# Patient Record
Sex: Female | Born: 1985 | Race: White | Hispanic: No | Marital: Married | Smoking: Current every day smoker
Health system: Southern US, Community
[De-identification: ages and names within clinical notes are randomized; demographics above are authoritative.]

---

## 2004-12-14 ENCOUNTER — Ambulatory Visit: Payer: Self-pay | Admitting: Family Medicine

## 2004-12-14 ENCOUNTER — Other Ambulatory Visit: Admission: RE | Admit: 2004-12-14 | Discharge: 2004-12-14 | Payer: Self-pay | Admitting: Family Medicine

## 2005-02-11 ENCOUNTER — Ambulatory Visit: Payer: Self-pay | Admitting: Family Medicine

## 2005-03-11 ENCOUNTER — Ambulatory Visit: Payer: Self-pay | Admitting: Sports Medicine

## 2005-04-06 ENCOUNTER — Emergency Department (HOSPITAL_COMMUNITY): Admission: EM | Admit: 2005-04-06 | Discharge: 2005-04-07 | Payer: Self-pay | Admitting: Emergency Medicine

## 2005-04-11 ENCOUNTER — Emergency Department (HOSPITAL_COMMUNITY): Admission: EM | Admit: 2005-04-11 | Discharge: 2005-04-12 | Payer: Self-pay | Admitting: Emergency Medicine

## 2005-04-24 ENCOUNTER — Ambulatory Visit: Payer: Self-pay | Admitting: Family Medicine

## 2005-05-10 ENCOUNTER — Ambulatory Visit: Payer: Self-pay | Admitting: Family Medicine

## 2005-05-17 ENCOUNTER — Emergency Department (HOSPITAL_COMMUNITY): Admission: EM | Admit: 2005-05-17 | Discharge: 2005-05-17 | Payer: Self-pay | Admitting: Emergency Medicine

## 2005-06-25 ENCOUNTER — Ambulatory Visit: Payer: Self-pay | Admitting: Sports Medicine

## 2005-06-26 ENCOUNTER — Ambulatory Visit (HOSPITAL_COMMUNITY): Admission: RE | Admit: 2005-06-26 | Discharge: 2005-06-26 | Payer: Self-pay | Admitting: Family Medicine

## 2005-07-22 ENCOUNTER — Emergency Department (HOSPITAL_COMMUNITY): Admission: EM | Admit: 2005-07-22 | Discharge: 2005-07-23 | Payer: Self-pay | Admitting: *Deleted

## 2005-07-23 ENCOUNTER — Ambulatory Visit: Payer: Self-pay | Admitting: Family Medicine

## 2005-10-04 ENCOUNTER — Ambulatory Visit: Payer: Self-pay | Admitting: Family Medicine

## 2005-10-09 ENCOUNTER — Ambulatory Visit (HOSPITAL_COMMUNITY): Admission: RE | Admit: 2005-10-09 | Discharge: 2005-10-09 | Payer: Self-pay | Admitting: Family Medicine

## 2005-10-17 ENCOUNTER — Inpatient Hospital Stay (HOSPITAL_COMMUNITY): Admission: AD | Admit: 2005-10-17 | Discharge: 2005-10-17 | Payer: Self-pay | Admitting: Obstetrics and Gynecology

## 2005-10-18 ENCOUNTER — Ambulatory Visit: Payer: Self-pay | Admitting: Family Medicine

## 2005-10-21 ENCOUNTER — Ambulatory Visit: Payer: Self-pay | Admitting: Obstetrics and Gynecology

## 2005-10-25 ENCOUNTER — Ambulatory Visit: Payer: Self-pay | Admitting: Sports Medicine

## 2005-11-04 ENCOUNTER — Ambulatory Visit: Payer: Self-pay | Admitting: Sports Medicine

## 2005-11-13 ENCOUNTER — Ambulatory Visit: Payer: Self-pay | Admitting: Family Medicine

## 2005-11-22 ENCOUNTER — Ambulatory Visit: Payer: Self-pay | Admitting: Family Medicine

## 2005-11-25 ENCOUNTER — Ambulatory Visit: Payer: Self-pay | Admitting: Obstetrics & Gynecology

## 2005-11-26 ENCOUNTER — Ambulatory Visit: Payer: Self-pay | Admitting: Sports Medicine

## 2005-11-27 ENCOUNTER — Inpatient Hospital Stay (HOSPITAL_COMMUNITY): Admission: AD | Admit: 2005-11-27 | Discharge: 2005-11-29 | Payer: Self-pay | Admitting: *Deleted

## 2005-11-27 ENCOUNTER — Ambulatory Visit: Payer: Self-pay | Admitting: Obstetrics and Gynecology

## 2006-01-02 ENCOUNTER — Ambulatory Visit: Payer: Self-pay | Admitting: Family Medicine

## 2006-01-04 ENCOUNTER — Encounter (INDEPENDENT_AMBULATORY_CARE_PROVIDER_SITE_OTHER): Payer: Self-pay | Admitting: *Deleted

## 2006-01-04 LAB — CONVERTED CEMR LAB

## 2006-01-23 ENCOUNTER — Ambulatory Visit: Payer: Self-pay | Admitting: Family Medicine

## 2006-03-04 ENCOUNTER — Emergency Department (HOSPITAL_COMMUNITY): Admission: EM | Admit: 2006-03-04 | Discharge: 2006-03-04 | Payer: Self-pay | Admitting: Emergency Medicine

## 2006-03-13 ENCOUNTER — Ambulatory Visit: Payer: Self-pay | Admitting: Family Medicine

## 2006-04-06 ENCOUNTER — Inpatient Hospital Stay (HOSPITAL_COMMUNITY): Admission: AD | Admit: 2006-04-06 | Discharge: 2006-04-08 | Payer: Self-pay | Admitting: Family Medicine

## 2006-04-06 ENCOUNTER — Encounter: Payer: Self-pay | Admitting: Emergency Medicine

## 2006-04-06 ENCOUNTER — Ambulatory Visit: Payer: Self-pay | Admitting: Family Medicine

## 2006-09-27 ENCOUNTER — Inpatient Hospital Stay (HOSPITAL_COMMUNITY): Admission: AD | Admit: 2006-09-27 | Discharge: 2006-09-27 | Payer: Self-pay | Admitting: Obstetrics and Gynecology

## 2006-10-03 ENCOUNTER — Inpatient Hospital Stay (HOSPITAL_COMMUNITY): Admission: AD | Admit: 2006-10-03 | Discharge: 2006-10-04 | Payer: Self-pay | Admitting: Obstetrics and Gynecology

## 2006-11-27 DIAGNOSIS — G43909 Migraine, unspecified, not intractable, without status migrainosus: Secondary | ICD-10-CM | POA: Insufficient documentation

## 2006-11-28 ENCOUNTER — Encounter (INDEPENDENT_AMBULATORY_CARE_PROVIDER_SITE_OTHER): Payer: Self-pay | Admitting: *Deleted

## 2007-01-30 IMAGING — US US FETAL BPP W/O NONSTRESS
1 series · 14 of 28 positions shown · non-contrast
Comparison: none

CLINICAL DATA: 35 week 3 day assigned gestational age.  Fetal tachycardia.  Evaluate BPP.

[Series 1: us fetal bpp w/o nonstress · 0.29mm/px · 14 of 35 slices shown]
[im 2/35]
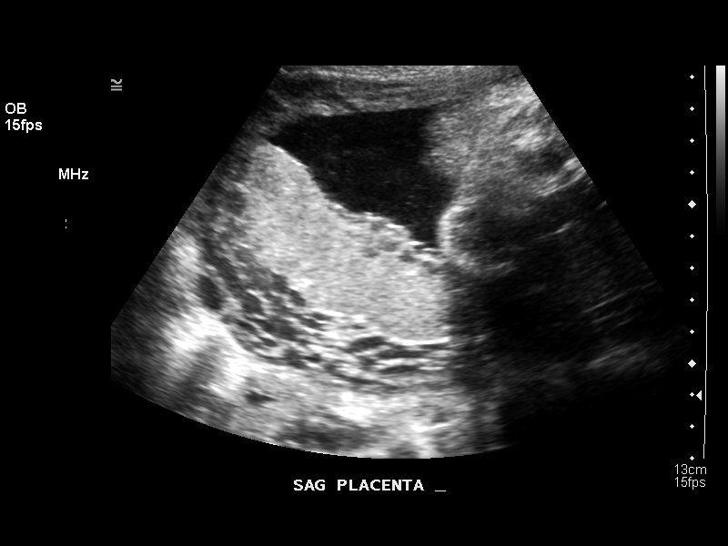
[im 4/35]
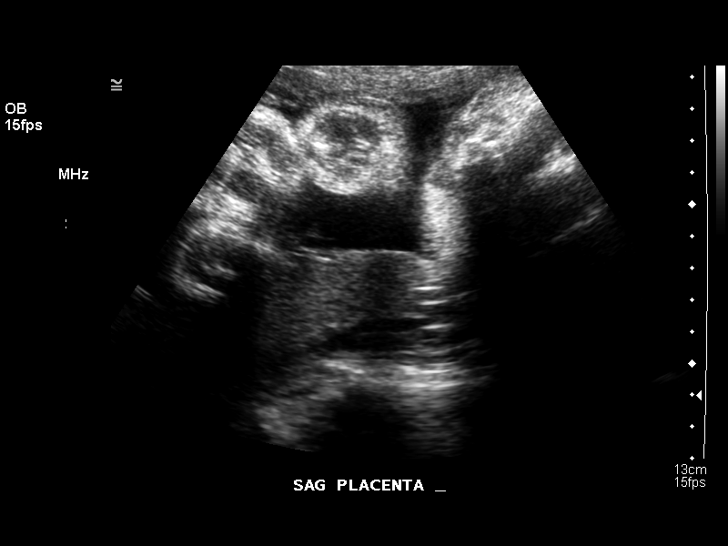
[im 7/35]
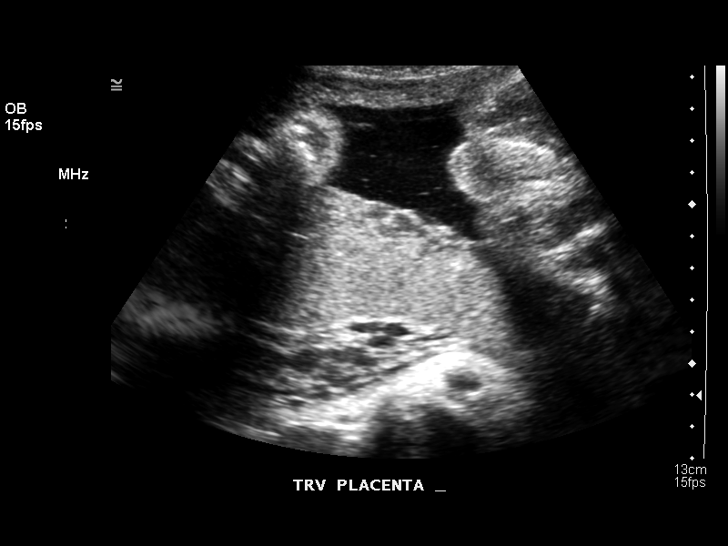
[im 9/35]
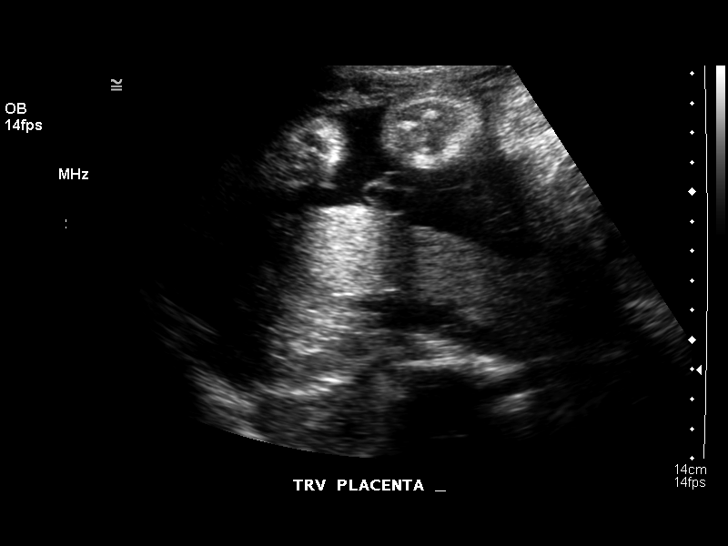
[im 12/35]
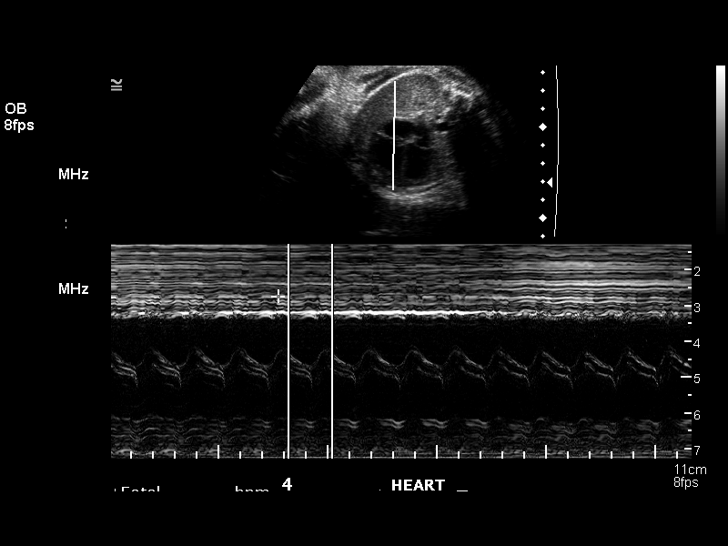
[im 14/35]
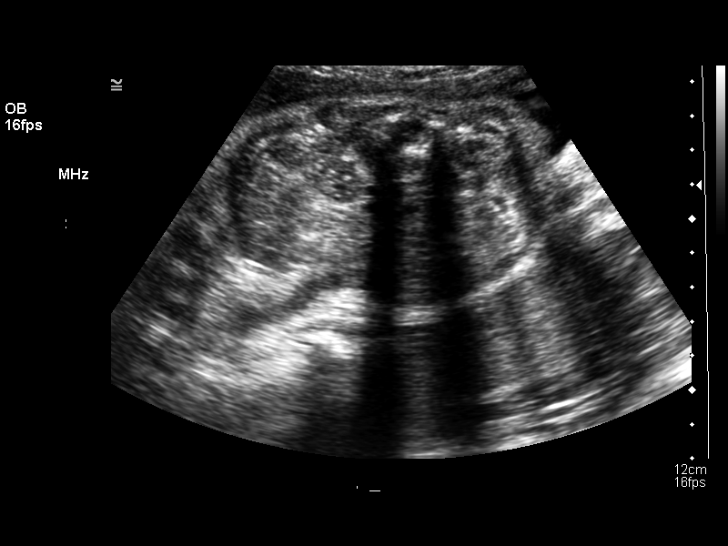
[im 17/35]
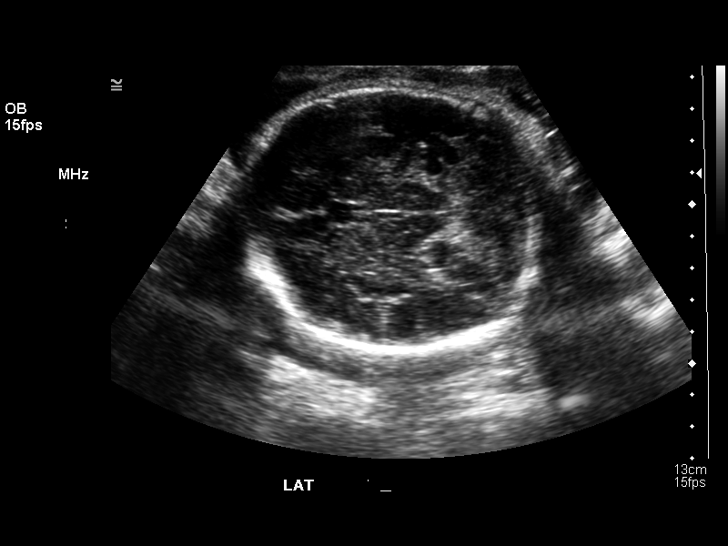
[im 19/35]
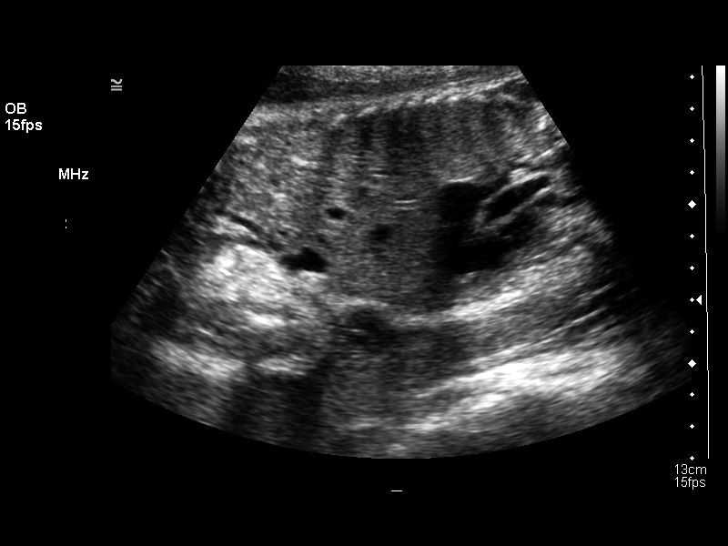
[im 22/35]
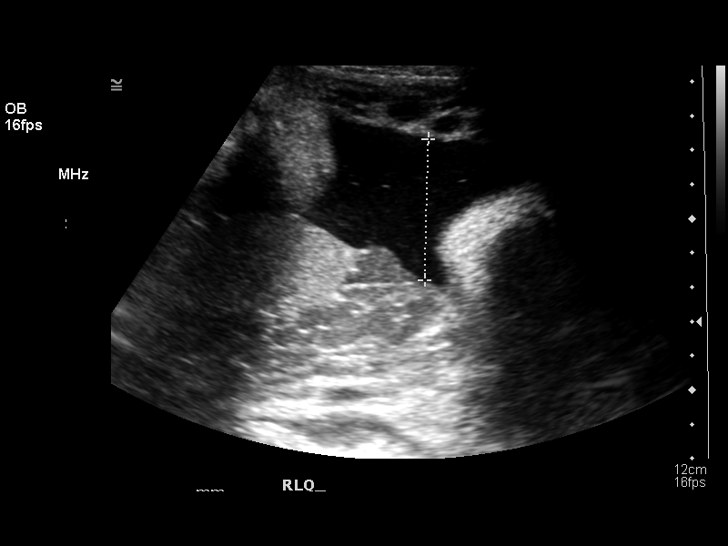
[im 24/35]
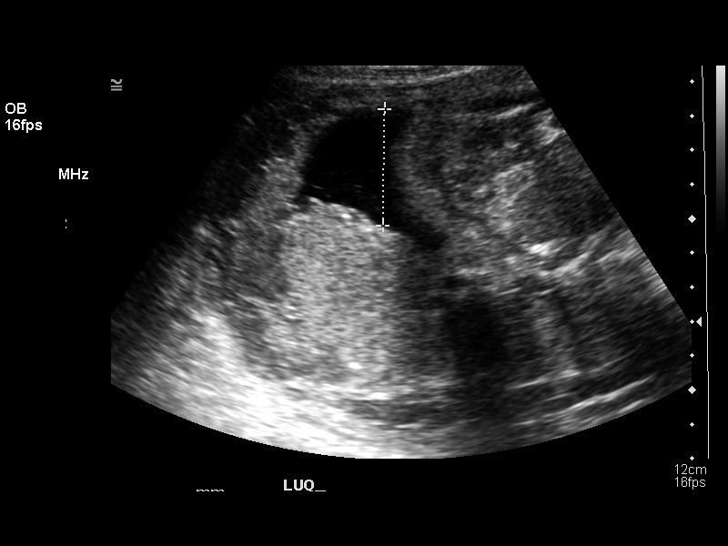
[im 27/35]
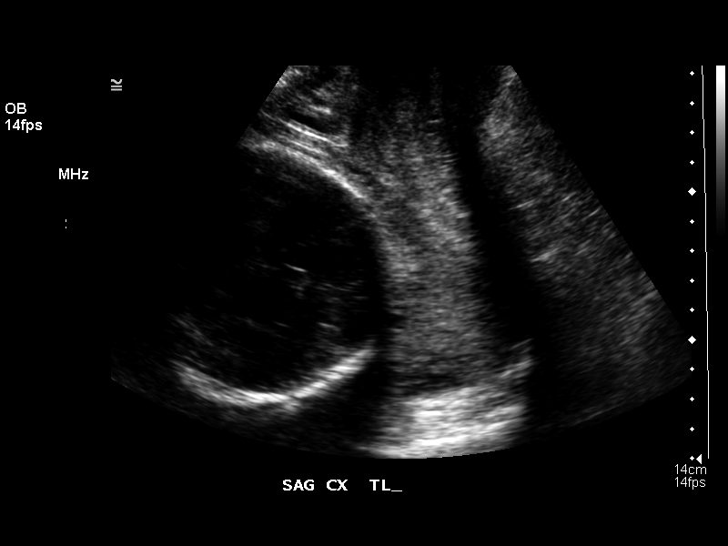
[im 29/35]
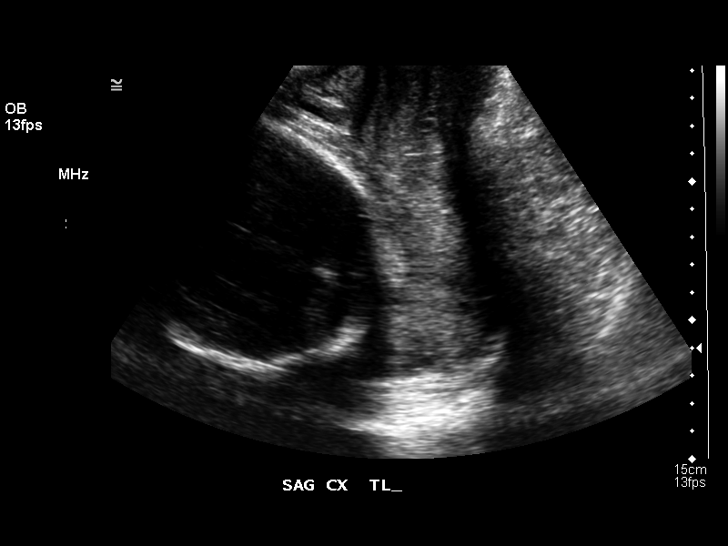
[im 32/35]
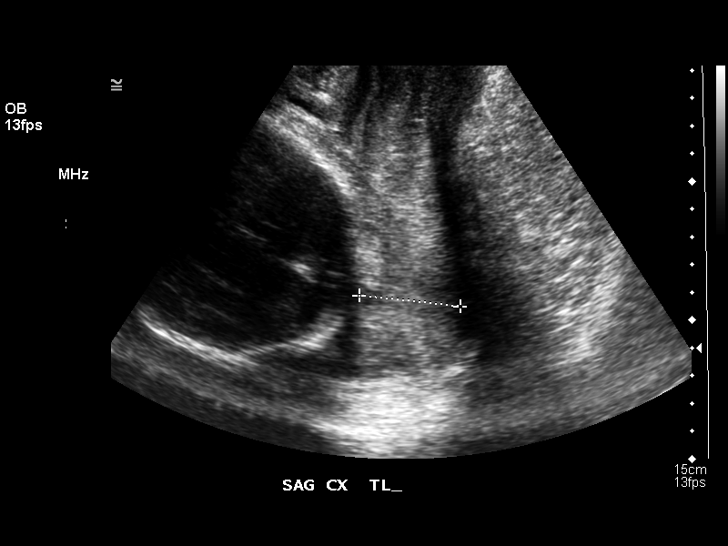
[im 35/35]
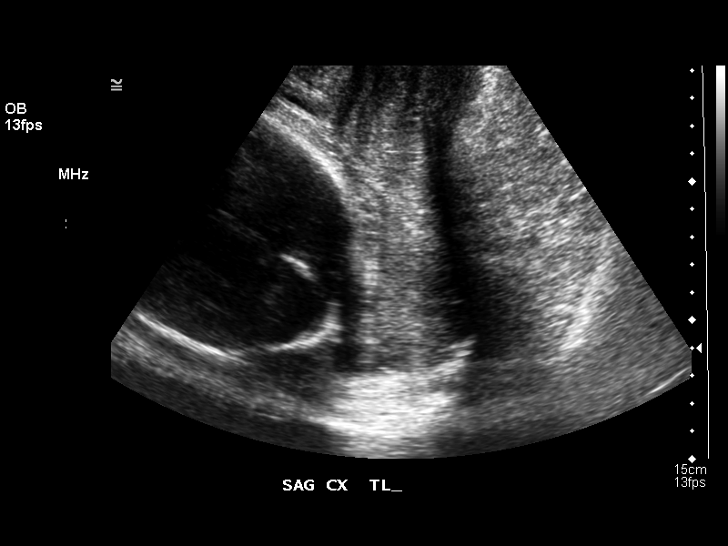

[14 of 28 positions shown; findings below may reference images not displayed]

BIOPHYSICAL PROFILE

 Number of Fetuses:  1
 Heart rate:  163
 Movement:  Yes
 Breathing:  No
 Presentation:  Cephalic
 Placental Location:  Posterior
 Grade:  I
 Previa:  No
 Amniotic Fluid (Subjective):  Normal
 Amniotic Fluid (Objective):  14.3 cm AFI (5th -95th%ile = 7.9 ? 24.9 cm for 35 wks)

 Fetal measurements and complete anatomic evaluation were not requested.  The following fetal anatomy was visualized on this exam:  Lateral ventricles, four chamber heart, stomach, 3-vessel cord, kidneys, bladder, and diaphragm.

 BPP SCORING
 Movements:  2  Time:  30 minutes
 Breathing:  0
 Tone:  2
 Amniotic Fluid: 2
 Total Score:  6

 MATERNAL UTERINE AND ADNEXAL FINDINGS
 Cervix:  3.6 cm Transabdominally
IMPRESSION: Single living intrauterine fetus in cephalic presentation.  Biophysical profile score [DATE].

## 2007-07-20 IMAGING — US US TRANSVAGINAL NON-OB
1 series · 13 of 25 positions shown · non-contrast
Comparison: none

CLINICAL DATA: 19-year-old with pelvic pain.  Rule out tuboovarian abscess.  The patient was begun on a course of antibiotics recently.  History of prior C-section.  IUD placed approximately three months ago.
TRANSABDOMINAL AND TRANSVAGINAL PELVIC ULTRASOUND:
TECHNIQUE: Both transabdominal and transvaginal ultrasound examinations of the pelvis were performed including evaluation of the uterus, ovaries, adnexal regions, and pelvic cul-de-sac.

[Series 1: gyn · 0.23mm/px · 13 of 47 slices shown]
[im 1/47]
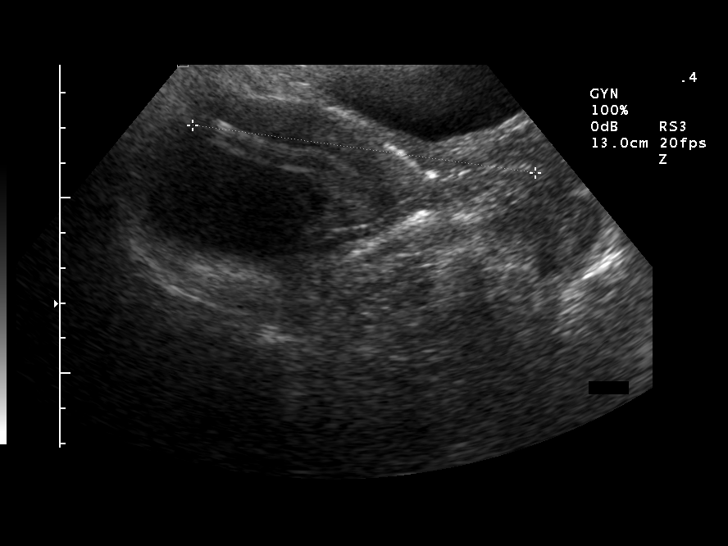
[im 4/47]
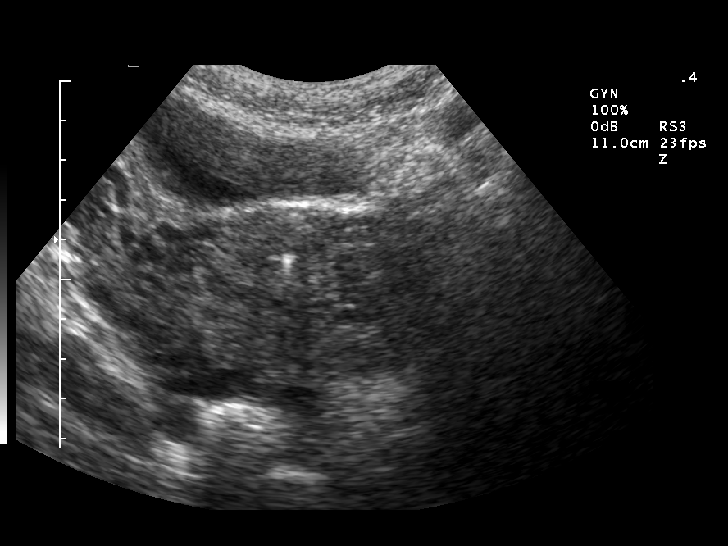
[im 8/47]
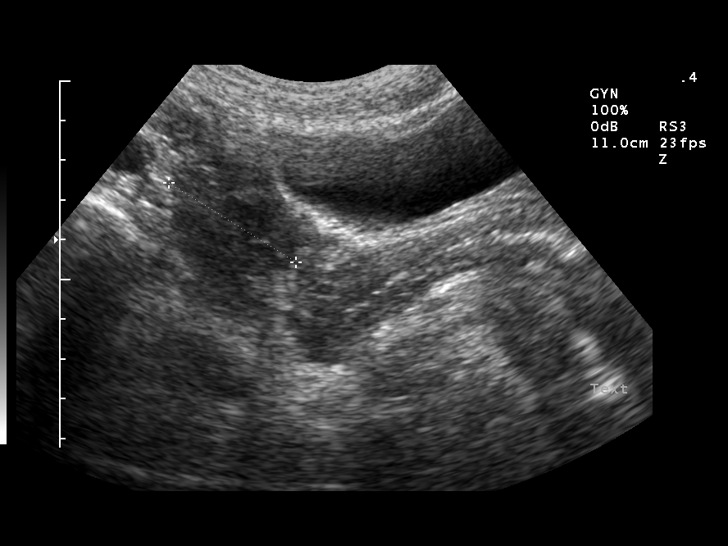
[im 12/47]
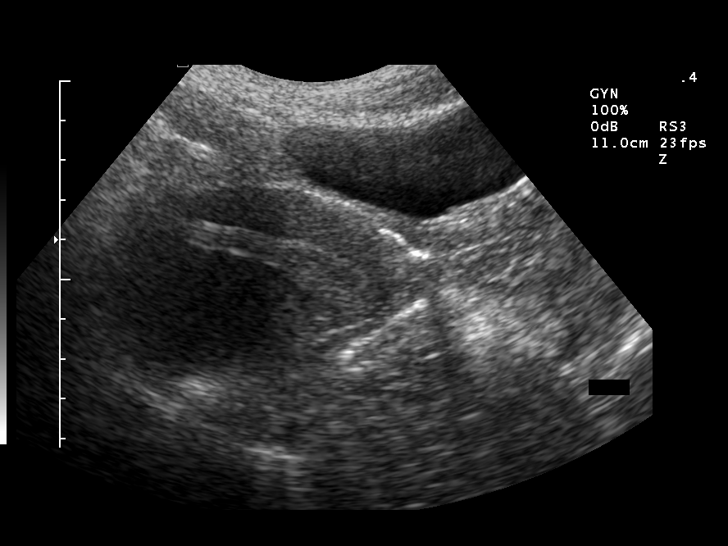
[im 16/47]
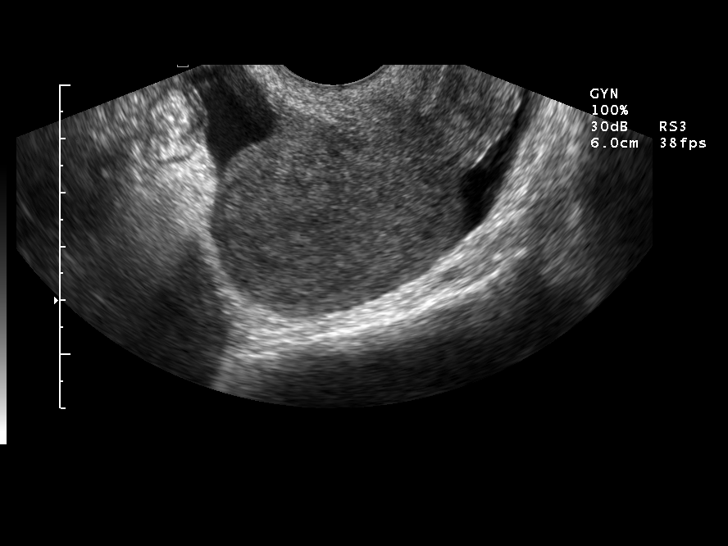
[im 20/47]
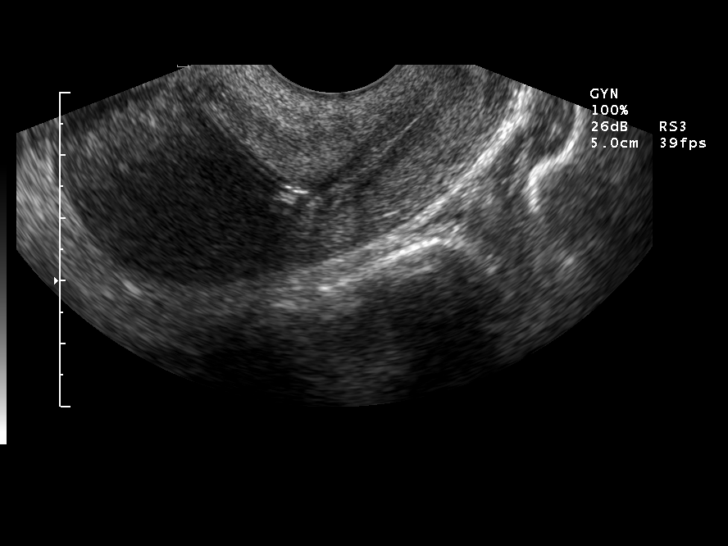
[im 24/47]
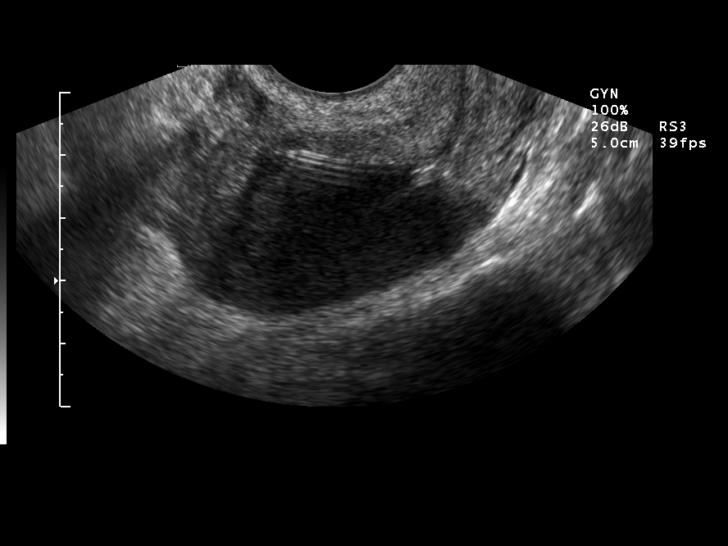
[im 27/47]
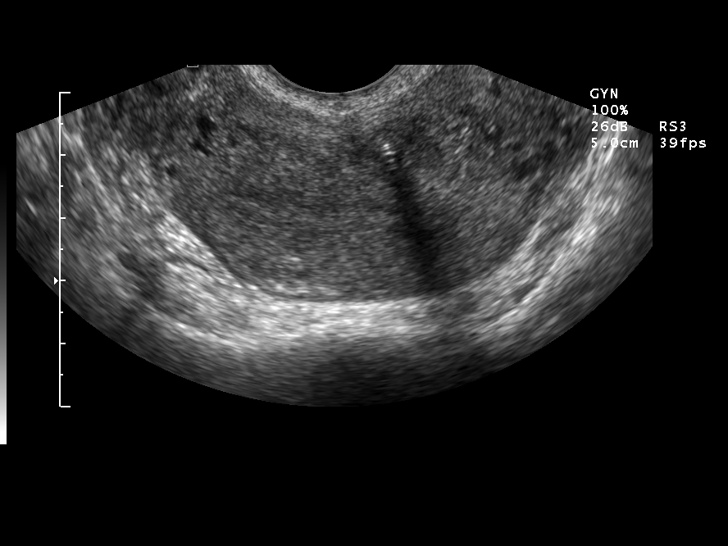
[im 31/47]
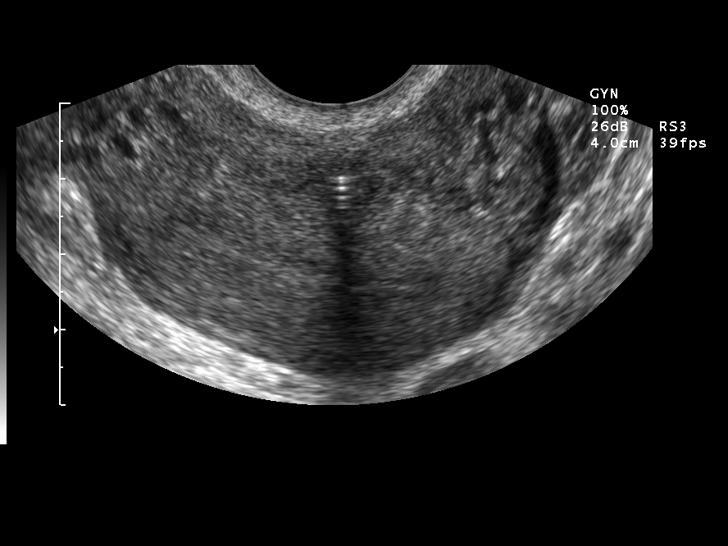
[im 35/47]
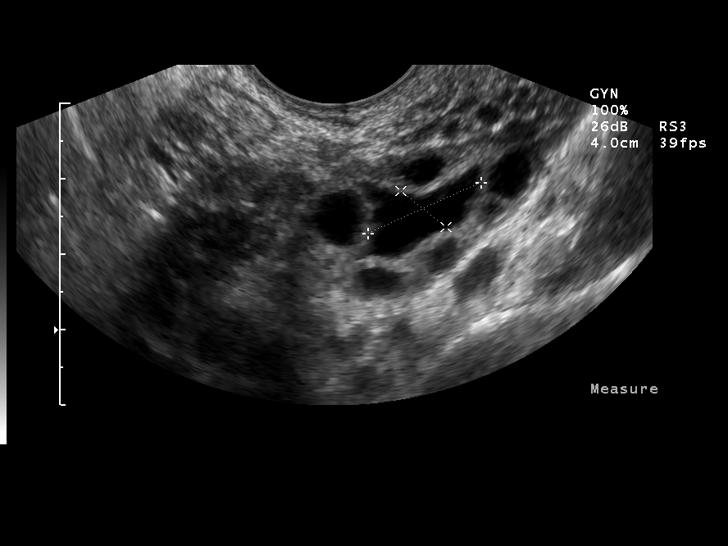
[im 39/47]
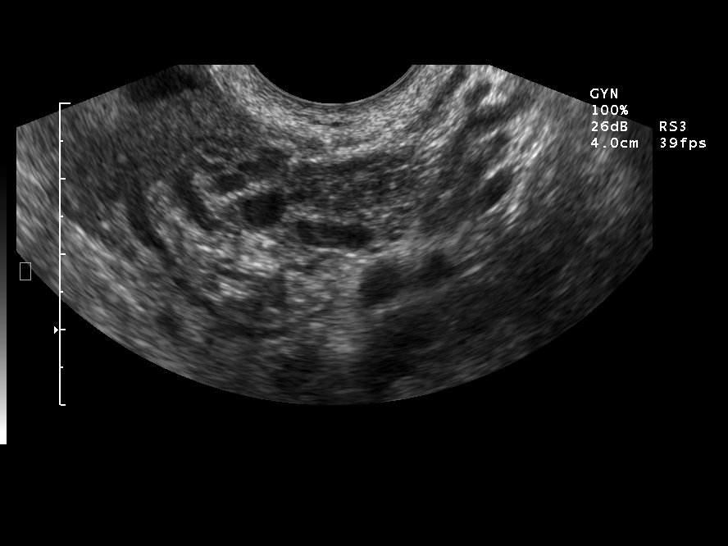
[im 43/47]
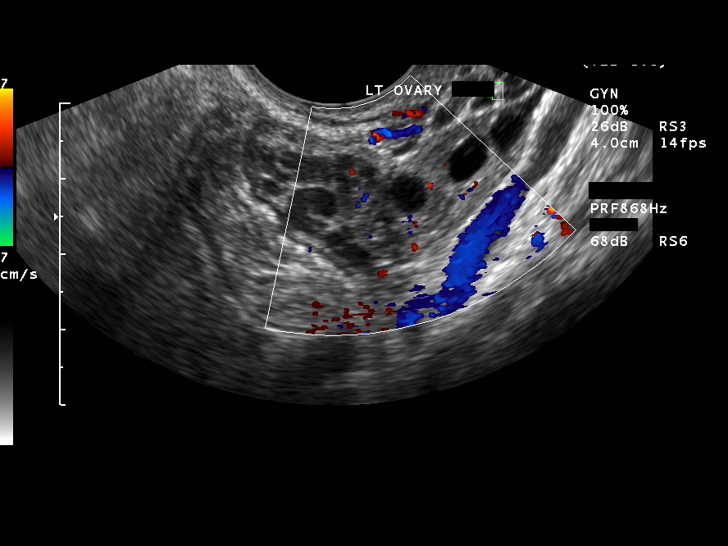
[im 47/47]
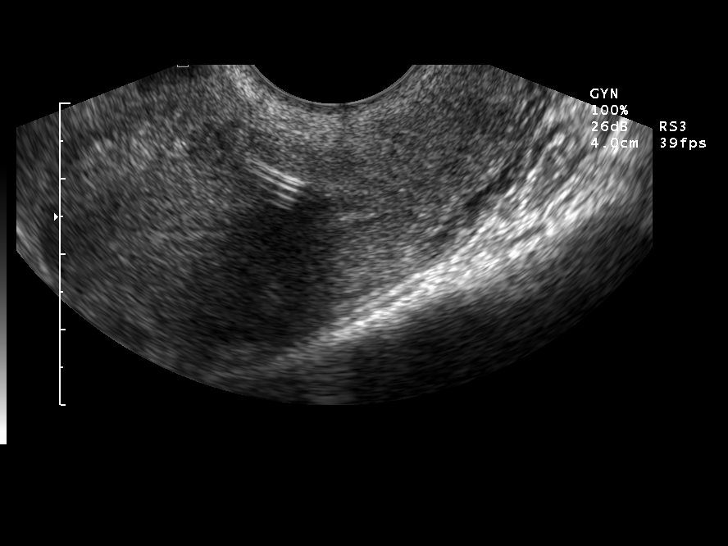

[13 of 25 positions shown; findings below may reference images not displayed]

FINDINGS: The uterus is 7.8 x 5.1 x 6.4 cm.  The endometrium is normal in thickness measuring 2.9 mm.  The patient has an intrauterine device which appears to be within the anterior myometrium, clearly anterior to the endometrial canal and slightly to the right of midline.  This may be within the patient?s C-section scar.  The uterus is otherwise normal in appearance.
Right ovary is 3.3 x 1.5 s 2.6 cm.  Left ovary is 3.8 x 1.8 x 2.9 cm.  There is prominence of the left fallopian tube and a small amount of fluid, consistent with hydrosalpinx.  No definite tuboovarian abscess at this time.  There is moderate free pelvic fluid and note is made of cervical motion tenderness at the time of exam.
IMPRESSION: 1.  Intrauterine device within the anterior myometrium, clearly anterior to the endometrial canal and possibly within the C-section scar.
2.  Prominence of the left fallopian tube consistent with hydrosalpinx or pyosalpinx.  Free pelvic fluid and cervical motion tenderness are consistent with pelvic inflammatory disease.  The findings were called to Dr. Sheen in the emergency department.

## 2019-04-22 ENCOUNTER — Other Ambulatory Visit: Payer: Self-pay

## 2019-04-22 ENCOUNTER — Encounter (HOSPITAL_COMMUNITY): Payer: Self-pay

## 2019-04-22 ENCOUNTER — Emergency Department (HOSPITAL_COMMUNITY)
Admission: EM | Admit: 2019-04-22 | Discharge: 2019-04-22 | Disposition: A | Discharge status: Home / Self Care | Payer: Self-pay | Attending: Emergency Medicine | Admitting: Emergency Medicine

## 2019-04-22 DIAGNOSIS — K529 Noninfective gastroenteritis and colitis, unspecified: Secondary | ICD-10-CM

## 2019-04-22 DIAGNOSIS — F17210 Nicotine dependence, cigarettes, uncomplicated: Secondary | ICD-10-CM | POA: Insufficient documentation

## 2019-04-22 DIAGNOSIS — A084 Viral intestinal infection, unspecified: Secondary | ICD-10-CM | POA: Insufficient documentation

## 2019-04-22 LAB — URINALYSIS, ROUTINE W REFLEX MICROSCOPIC
Bacteria, UA: NONE SEEN
Bilirubin Urine: NEGATIVE
Glucose, UA: NEGATIVE mg/dL
Ketones, ur: 80 mg/dL — AB
Leukocytes,Ua: NEGATIVE
Nitrite: NEGATIVE
Protein, ur: 100 mg/dL — AB
Specific Gravity, Urine: 1.03 (ref 1.005–1.030)
pH: 6 (ref 5.0–8.0)

## 2019-04-22 LAB — COMPREHENSIVE METABOLIC PANEL
ALT: 18 U/L (ref 0–44)
AST: 23 U/L (ref 15–41)
Albumin: 4 g/dL (ref 3.5–5.0)
Alkaline Phosphatase: 49 U/L (ref 38–126)
Anion gap: 14 (ref 5–15)
BUN: 13 mg/dL (ref 6–20)
CO2: 21 mmol/L — ABNORMAL LOW (ref 22–32)
Calcium: 9.1 mg/dL (ref 8.9–10.3)
Chloride: 104 mmol/L (ref 98–111)
Creatinine, Ser: 0.75 mg/dL (ref 0.44–1.00)
GFR calc Af Amer: >60 mL/min (ref >60)
GFR calc non Af Amer: >60 mL/min (ref >60)
Glucose, Bld: 89 mg/dL (ref 70–99)
Potassium: 3.7 mmol/L (ref 3.5–5.1)
Sodium: 139 mmol/L (ref 135–145)
Total Bilirubin: 1.2 mg/dL (ref 0.3–1.2)
Total Protein: 6.9 g/dL (ref 6.5–8.1)

## 2019-04-22 LAB — CBC
HCT: 41.8 % (ref 36.0–46.0)
Hemoglobin: 13.6 g/dL (ref 12.0–15.0)
MCH: 28.8 pg (ref 26.0–34.0)
MCHC: 32.5 g/dL (ref 30.0–36.0)
MCV: 88.4 fL (ref 80.0–100.0)
Platelets: 233 10*3/uL (ref 150–400)
RBC: 4.73 MIL/uL (ref 3.87–5.11)
RDW: 13.7 % (ref 11.5–15.5)
WBC: 14.7 10*3/uL — ABNORMAL HIGH (ref 4.0–10.5)
nRBC: 0 % (ref 0.0–0.2)

## 2019-04-22 LAB — I-STAT BETA HCG BLOOD, ED (MC, WL, AP ONLY): I-stat hCG, quantitative: <5 m[IU]/mL (ref <5)

## 2019-04-22 LAB — LIPASE, BLOOD: Lipase: 21 U/L (ref 11–51)

## 2019-04-22 MED ORDER — ONDANSETRON 8 MG PO TBDP: ORAL_TABLET | ORAL | 0 refills | Status: DC

## 2019-04-22 MED ORDER — SODIUM CHLORIDE 0.9 % IV BOLUS
1000.0000 mL | Freq: Once | INTRAVENOUS | Status: AC
  Administered 2019-04-22: 999 mL via INTRAVENOUS

## 2019-04-22 MED ORDER — KETOROLAC TROMETHAMINE 30 MG/ML IJ SOLN
30.0000 mg | Freq: Once | INTRAMUSCULAR | Status: AC
  Administered 2019-04-22: 30 mg via INTRAVENOUS
  Filled 2019-04-22: qty 1

## 2019-04-22 MED ORDER — SODIUM CHLORIDE 0.9% FLUSH
3.0000 mL | Freq: Once | INTRAVENOUS | Status: AC
  Administered 2019-04-22: 3 mL via INTRAVENOUS

## 2019-04-22 MED ORDER — ONDANSETRON HCL 4 MG/2ML IJ SOLN
4.0000 mg | Freq: Once | INTRAMUSCULAR | Status: AC | PRN
  Administered 2019-04-22: 16:00:00 4 mg via INTRAVENOUS
  Filled 2019-04-22: qty 2

## 2019-04-22 NOTE — ED Provider Notes (Signed)
[facility] DEPT Provider Note   CSN: [id number] Arrival date & time: 04/22/19  1536     History   Chief Complaint No chief complaint on file.   HPI Tracy Lindsey is a 33 y.o. female.     Patient is a 33 year-old female with no significant past medical history.  She presents with complaints of epigastric pain, nausea, and vomiting.  This started yesterday after eating a chicken sandwich while vacationing at [facility].  She was seen at a facility there and underwent laboratory studies and a CT scan.  These were apparently all unremarkable and she was discharged.  She tells me she also had a COVID test performed which was negative.  Patient presents here with ongoing nausea and vomiting and difficulty keeping solids or liquids down.  She denies any diarrhea.  She denies any bloody stool.  She denies any fevers.  The history is provided by the patient.    History reviewed. No pertinent past medical history.  Patient Active Problem List   Diagnosis Date Noted  . MIGRAINE, UNSPEC., W/O INTRACTABLE MIGRAINE 11/27/2006       OB History   No obstetric history on file.      Home Medications    Prior to Admission medications   Not on File    Family History History reviewed. No pertinent family history.  Social History Social History   Tobacco Use  . Smoking status: Current Every Day Smoker    Packs/day: 0.50    Types: Cigarettes  . Smokeless tobacco: Never Used  Substance Use Topics  . Alcohol use: Yes    Comment: socially  . Drug use: Yes    Frequency: 2.0 times per week    Types: Marijuana     Allergies   Patient has no allergy information on record.   Review of Systems Review of Systems  All other systems reviewed and are negative.    Physical Exam Updated Vital Signs BP (!) 127/96 (BP Location: Right Arm)   Pulse (!) 50   Temp 98.1 F (36.7 C) (Oral)   LMP 04/16/2019   SpO2 99%   Physical Exam Vitals signs  and nursing note reviewed.  Constitutional:      General: She is not in acute distress.    Appearance: She is well-developed. She is not diaphoretic.  HENT:     Head: Normocephalic and atraumatic.  Neck:     Musculoskeletal: Normal range of motion and neck supple.  Cardiovascular:     Rate and Rhythm: Normal rate and regular rhythm.     Heart sounds: No murmur. No friction rub. No gallop.   Pulmonary:     Effort: Pulmonary effort is normal. No respiratory distress.     Breath sounds: Normal breath sounds. No wheezing.  Abdominal:     General: Bowel sounds are normal. There is no distension.     Palpations: Abdomen is soft.     Tenderness: There is abdominal tenderness. There is no right CVA tenderness, left CVA tenderness, guarding or rebound.     Comments: There is mild tenderness in the epigastric region.  Musculoskeletal: Normal range of motion.  Skin:    General: Skin is warm and dry.  Neurological:     Mental Status: She is alert and oriented to person, place, and time.      ED Treatments / Results  Labs (all labs ordered are listed, but only abnormal results are displayed) Labs Reviewed  LIPASE, BLOOD  COMPREHENSIVE METABOLIC PANEL  CBC  URINALYSIS, ROUTINE W REFLEX MICROSCOPIC  I-STAT BETA HCG BLOOD, ED ([facility], [facility], [facility] ONLY)    EKG None  Radiology No results found.  Procedures Procedures (including critical care time)  Medications Ordered in ED Medications  sodium chloride flush (NS) 0.9 % injection 3 mL (has no administration in time range)  sodium chloride 0.9 % bolus 1,000 mL (has no administration in time range)  ketorolac (TORADOL) 30 MG/ML injection 30 mg (has no administration in time range)  ondansetron (ZOFRAN) injection 4 mg (4 mg Intravenous Given 04/22/19 1614)     Initial Impression / Assessment and Plan / ED Course  I have reviewed the triage vital signs and the nursing notes.  Pertinent labs & imaging results that were available during my  care of the patient were reviewed by me and considered in my medical decision making (see chart for details).  Patient with a 2-day history of nausea and vomiting since [place] [facility].  Patient's abdominal exam is benign and laboratory studies are reassuring.  She does have a slight leukocytosis, likely related to stress or possible viral infection.  Patient given IV fluids, Toradol, and Zofran and is feeling significantly improved.  At this point, I see no indication for further work-up.  Symptoms likely viral or foodborne illness.  To return as needed for any problems.  Final Clinical Impressions(s) / ED Diagnoses   Final diagnoses:  None    ED Discharge Orders    None       Geoffery Lyonselo, Dea Bitting, MD 04/22/19 [phone]

## 2019-04-22 NOTE — Discharge Instructions (Addendum)
Zofran as prescribed as needed for nausea.  Clear liquid diet for the next 12 hours, then slowly advance to normal as tolerated.  Return to the emergency department for severe abdominal pain, high fevers, bloody stools, or other new and concerning symptoms.

## 2019-04-22 NOTE — ED Notes (Signed)
This nurse noted pt to become bradycardic around 48-52 after periods of vomiting and while resting. Pt denies hx of same, EDP notified and and pt placed on heart monitor.

## 2019-04-22 NOTE — ED Triage Notes (Signed)
Pt presents to the ED c/o generalized abdominal pain and N/V since yesterday. Pt states she returned from the beach last night and was tested for COVID yesterday as well and her rapid results were negative. Pt also admits to marijuana use today but states she smokes normally and has never had a reaction like this before.

## 2019-09-21 ENCOUNTER — Emergency Department (HOSPITAL_COMMUNITY)
Admission: EM | Admit: 2019-09-21 | Discharge: 2019-09-21 | Disposition: A | Discharge status: Home / Self Care | Payer: Self-pay | Attending: Emergency Medicine | Admitting: Emergency Medicine

## 2019-09-21 ENCOUNTER — Encounter (HOSPITAL_COMMUNITY): Payer: Self-pay | Admitting: Emergency Medicine

## 2019-09-21 ENCOUNTER — Other Ambulatory Visit: Payer: Self-pay

## 2019-09-21 DIAGNOSIS — R1013 Epigastric pain: Secondary | ICD-10-CM | POA: Insufficient documentation

## 2019-09-21 DIAGNOSIS — F17210 Nicotine dependence, cigarettes, uncomplicated: Secondary | ICD-10-CM | POA: Insufficient documentation

## 2019-09-21 DIAGNOSIS — N3000 Acute cystitis without hematuria: Secondary | ICD-10-CM | POA: Insufficient documentation

## 2019-09-21 DIAGNOSIS — R112 Nausea with vomiting, unspecified: Secondary | ICD-10-CM | POA: Insufficient documentation

## 2019-09-21 DIAGNOSIS — F1290 Cannabis use, unspecified, uncomplicated: Secondary | ICD-10-CM | POA: Insufficient documentation

## 2019-09-21 LAB — URINALYSIS, ROUTINE W REFLEX MICROSCOPIC
Bilirubin Urine: NEGATIVE
Glucose, UA: NEGATIVE mg/dL
Ketones, ur: 20 mg/dL — AB
Nitrite: NEGATIVE
Protein, ur: 100 mg/dL — AB
Specific Gravity, Urine: 1.03 (ref 1.005–1.030)
pH: 6 (ref 5.0–8.0)

## 2019-09-21 LAB — CBC
HCT: 44 % (ref 36.0–46.0)
Hemoglobin: 14.4 g/dL (ref 12.0–15.0)
MCH: 29.1 pg (ref 26.0–34.0)
MCHC: 32.7 g/dL (ref 30.0–36.0)
MCV: 88.9 fL (ref 80.0–100.0)
Platelets: 267 10*3/uL (ref 150–400)
RBC: 4.95 MIL/uL (ref 3.87–5.11)
RDW: 13.7 % (ref 11.5–15.5)
WBC: 8.6 10*3/uL (ref 4.0–10.5)
nRBC: 0 % (ref 0.0–0.2)

## 2019-09-21 LAB — COMPREHENSIVE METABOLIC PANEL
ALT: 19 U/L (ref 0–44)
AST: 18 U/L (ref 15–41)
Albumin: 4.5 g/dL (ref 3.5–5.0)
Alkaline Phosphatase: 59 U/L (ref 38–126)
Anion gap: 14 (ref 5–15)
BUN: 14 mg/dL (ref 6–20)
CO2: 26 mmol/L (ref 22–32)
Calcium: 9.5 mg/dL (ref 8.9–10.3)
Chloride: 101 mmol/L (ref 98–111)
Creatinine, Ser: 0.85 mg/dL (ref 0.44–1.00)
GFR calc Af Amer: >60 mL/min (ref >60)
GFR calc non Af Amer: >60 mL/min (ref >60)
Glucose, Bld: 81 mg/dL (ref 70–99)
Potassium: 3.7 mmol/L (ref 3.5–5.1)
Sodium: 141 mmol/L (ref 135–145)
Total Bilirubin: 0.8 mg/dL (ref 0.3–1.2)
Total Protein: 7.3 g/dL (ref 6.5–8.1)

## 2019-09-21 LAB — I-STAT BETA HCG BLOOD, ED (MC, WL, AP ONLY): I-stat hCG, quantitative: <5 m[IU]/mL (ref <5)

## 2019-09-21 LAB — LIPASE, BLOOD: Lipase: 29 U/L (ref 11–51)

## 2019-09-21 MED ORDER — CEPHALEXIN 500 MG PO CAPS: 500.0000 mg | ORAL_CAPSULE | Freq: Two times a day (BID) | ORAL | 0 refills | Status: AC

## 2019-09-21 MED ORDER — HALOPERIDOL LACTATE 5 MG/ML IJ SOLN
2.0000 mg | Freq: Once | INTRAMUSCULAR | Status: AC
  Administered 2019-09-21: 2 mg via INTRAVENOUS
  Filled 2019-09-21: qty 1

## 2019-09-21 MED ORDER — ONDANSETRON 4 MG PO TBDP: ORAL_TABLET | ORAL | 0 refills | Status: DC

## 2019-09-21 MED ORDER — SODIUM CHLORIDE 0.9% FLUSH: 3.0000 mL | Freq: Once | INTRAVENOUS | Status: DC

## 2019-09-21 MED ORDER — ONDANSETRON 4 MG PO TBDP: 4.0000 mg | ORAL_TABLET | Freq: Three times a day (TID) | ORAL | 0 refills | Status: AC | PRN

## 2019-09-21 MED ORDER — SODIUM CHLORIDE 0.9 % IV BOLUS
1000.0000 mL | Freq: Once | INTRAVENOUS | Status: AC
  Administered 2019-09-21: 1000 mL via INTRAVENOUS

## 2019-09-21 MED ORDER — SODIUM CHLORIDE 0.9 % IV SOLN
1.0000 g | Freq: Once | INTRAVENOUS | Status: AC
Start: 2019-09-21 — End: 2019-09-21
  Administered 2019-09-21: 1 g via INTRAVENOUS
  Filled 2019-09-21: qty 10

## 2019-09-21 NOTE — ED Provider Notes (Signed)
[facility] EMERGENCY DEPARTMENT Provider Note   CSN: [id number] Arrival date & time: 09/21/19  1232     History Chief Complaint  Patient presents with  . Abdominal Pain    Tracy Lindsey is a 33 y.o. female.  33 year old female with history of migraines presents with complaint of epigastric abdominal pain x2 days, described as burning in nature, constant, does not radiate, nothing makes her pain better or worse.  Associated with nausea and vomiting, reports greater than 10 episodes of nonbloody emesis daily, unable to tolerate anything p.o.  Denies changes in bowel or bladder habits.  Denies possibility of pregnancy.  Patient denies regular alcohol use, history of pancreatitis.  Patient does smoke cigarettes as well as marijuana, denies other drug use.  No other complaints or concerns.        History reviewed. No pertinent past medical history.  Patient Active Problem List   Diagnosis Date Noted  . MIGRAINE, UNSPEC., W/O INTRACTABLE MIGRAINE 11/27/2006    History reviewed. No pertinent surgical history.   OB History   No obstetric history on file.     No family history on file.  Social History   Tobacco Use  . Smoking status: Current Every Day Smoker    Packs/day: 0.50    Types: Cigarettes  . Smokeless tobacco: Never Used  Substance Use Topics  . Alcohol use: Yes    Comment: socially  . Drug use: Yes    Frequency: 2.0 times per week    Types: Marijuana    Home Medications Prior to Admission medications   Medication Sig Start Date End Date Taking? Authorizing Provider  cephALEXin (KEFLEX) 500 MG capsule Take 1 capsule (500 mg total) by mouth 2 (two) times daily for 5 days. 09/21/19 09/26/19  Jeannie Fend, PA-C  ondansetron (ZOFRAN ODT) 4 MG disintegrating tablet Take 1 tablet (4 mg total) by mouth every 8 (eight) hours as needed for nausea or vomiting. 09/21/19   Jeannie Fend, PA-C    Allergies    Patient has no known  allergies.  Review of Systems   Review of Systems  Constitutional: Negative for chills and fever.  Respiratory: Negative for shortness of breath.   Cardiovascular: Negative for chest pain.  Gastrointestinal: Positive for abdominal pain, nausea and vomiting. Negative for constipation and diarrhea.  Genitourinary: Negative for vaginal discharge.  Musculoskeletal: Negative for arthralgias and myalgias.  Skin: Negative for rash and wound.  Allergic/Immunologic: Negative for immunocompromised state.  Neurological: Negative for weakness.  Hematological: Negative for adenopathy.  Psychiatric/Behavioral: Negative for confusion.  All other systems reviewed and are negative.   Physical Exam Updated Vital Signs BP 124/71 (BP Location: Right Arm)   Pulse 68   Temp 98.8 F (37.1 C) (Oral)   Resp 14   LMP 09/07/2019   SpO2 100%   Physical Exam Vitals and nursing note reviewed.  Constitutional:      General: She is not in acute distress.    Appearance: She is well-developed. She is not diaphoretic.  HENT:     Head: Normocephalic and atraumatic.  Cardiovascular:     Rate and Rhythm: Normal rate and regular rhythm.     Heart sounds: Normal heart sounds.  Pulmonary:     Effort: Pulmonary effort is normal.     Breath sounds: Normal breath sounds.  Abdominal:     General: Abdomen is flat.     Palpations: Abdomen is soft.     Tenderness: There is no abdominal  tenderness. There is no right CVA tenderness or left CVA tenderness.  Skin:    General: Skin is warm and dry.     Findings: No rash.  Neurological:     Mental Status: She is alert and oriented to person, place, and time.  Psychiatric:        Behavior: Behavior normal.     ED Results / Procedures / Treatments   Labs (all labs ordered are listed, but only abnormal results are displayed) Labs Reviewed  URINALYSIS, ROUTINE W REFLEX MICROSCOPIC - Abnormal; Notable for the following components:      Result Value   APPearance  CLOUDY (*)    Hgb urine dipstick MODERATE (*)    Ketones, ur 20 (*)    Protein, ur 100 (*)    Leukocytes,Ua MODERATE (*)    Bacteria, UA MANY (*)    All other components within normal limits  LIPASE, BLOOD  COMPREHENSIVE METABOLIC PANEL  CBC  I-STAT BETA HCG BLOOD, ED ([facility], [facility], [facility] ONLY)    EKG None  Radiology No results found.  Procedures Procedures (including critical care time)  Medications Ordered in ED Medications  sodium chloride flush (NS) 0.9 % injection 3 mL (has no administration in time range)  sodium chloride 0.9 % bolus 1,000 mL (0 mLs Intravenous Stopped 09/21/19 1447)  haloperidol lactate (HALDOL) injection 2 mg (2 mg Intravenous Given 09/21/19 1418)  cefTRIAXone (ROCEPHIN) 1 g in sodium chloride 0.9 % 100 mL IVPB (0 g Intravenous Stopped 09/21/19 1447)    ED Course  I have reviewed the triage vital signs and the nursing notes.  Pertinent labs & imaging results that were available during my care of the patient were reviewed by me and considered in my medical decision making (see chart for details).  Clinical Course as of Sep 20 1504  Tue Sep 20, 1981  [id number] 33 year old female with epigastric pain x2 days with nausea and vomiting, unable to tolerate anything p.o.  Exam abdomen is soft and nontender, no active vomiting, patient appears uncomfortable.  Patient does smoke marijuana, consider cannabinoid hyperemesis.  Patient was treated with Haldol, no further vomiting while in the ER.  Urinalysis is concerning for urinary tract infection, will give her Rocephin while in the ER and discharged with Keflex.  CBC, CMP, lipase within normal limits.  Vital signs normal limits.  Patient given prescription for Keflex and Zofran, return to ER for new or worsening symptoms, discontinue marijuana use.   [LM]    Clinical Course User Index [LM] Roque Lias   MDM Rules/Calculators/A&P                      Final Clinical Impression(s) / ED Diagnoses Final diagnoses:   Epigastric pain  Acute cystitis without hematuria  Non-intractable vomiting with nausea, unspecified vomiting type    Rx / DC Orders ED Discharge Orders         Ordered    cephALEXin (KEFLEX) 500 MG capsule  2 times daily     09/21/19 1443    ondansetron (ZOFRAN ODT) 4 MG disintegrating tablet  Every 8 hours PRN     09/21/19 1444    ondansetron (ZOFRAN ODT) 4 MG disintegrating tablet  Status:  Discontinued     09/21/19 1444           Tacy Learn, PA-C 09/21/19 1505    Quintella Reichert, MD 09/22/19 0930

## 2019-09-21 NOTE — Discharge Instructions (Addendum)
Take Keflex as prescribed and complete the full course for your urinary tract infection. Take Zofran as needed as prescribed for nausea and vomiting. Vomiting is sometimes caused by marijuana, discontinue all use. Follow-up with your primary care provider, return to ER for new or worsening symptoms.

## 2019-09-21 NOTE — ED Triage Notes (Signed)
Pt to ER for evaluation of generalized abdominal pain onset 2 nights ago with vomiting and minimal diarrhea. Reports vomiting more than 10 times per day. Denies dysuria or vaginal discharge. Reports she has not had sexual intercourse until 2 nights ago immediately preceding these symptoms. NAD at this time.

## 2019-09-21 NOTE — ED Notes (Signed)
Patient verbalizes understanding of discharge instructions. Opportunity for questioning and answers were provided. Armband removed by staff, pt discharged from ED. Ambulated out to lobby
